# Patient Record
Sex: Male | Born: 2000 | Race: Black or African American | Hispanic: No | Marital: Single | Smoking: Never smoker
Health system: Southern US, Community
[De-identification: ages and names within clinical notes are randomized; demographics above are authoritative.]

---
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
## 2019-01-25 IMAGING — DX DG CHEST 2V
2 series · 2 of 2 positions shown · non-contrast
Comparison: None.

CLINICAL DATA: Productive cough since October 2017. New shortness of
breath.

EXAM:
CHEST - 2 VIEW

[w chest pa]
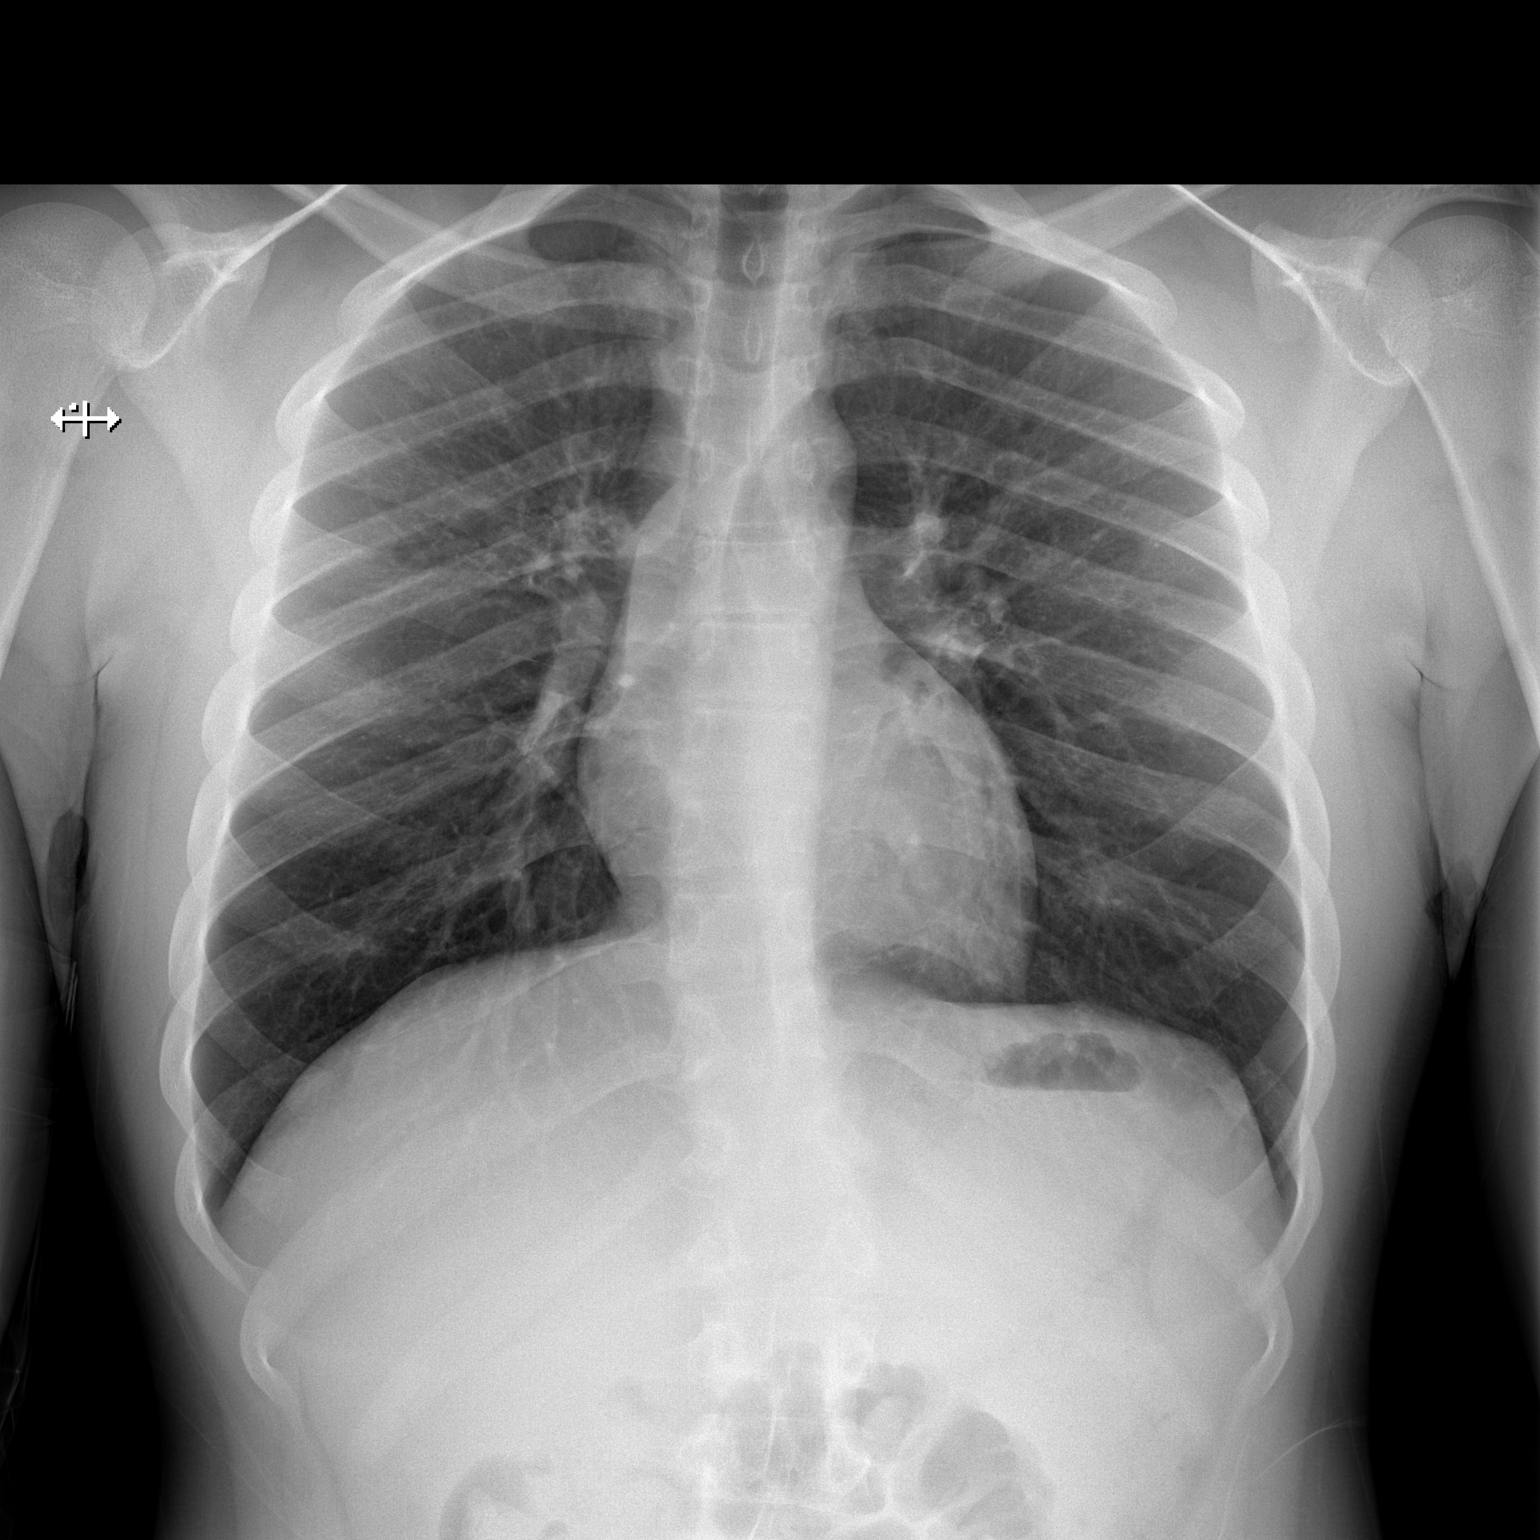

[w chest lat]
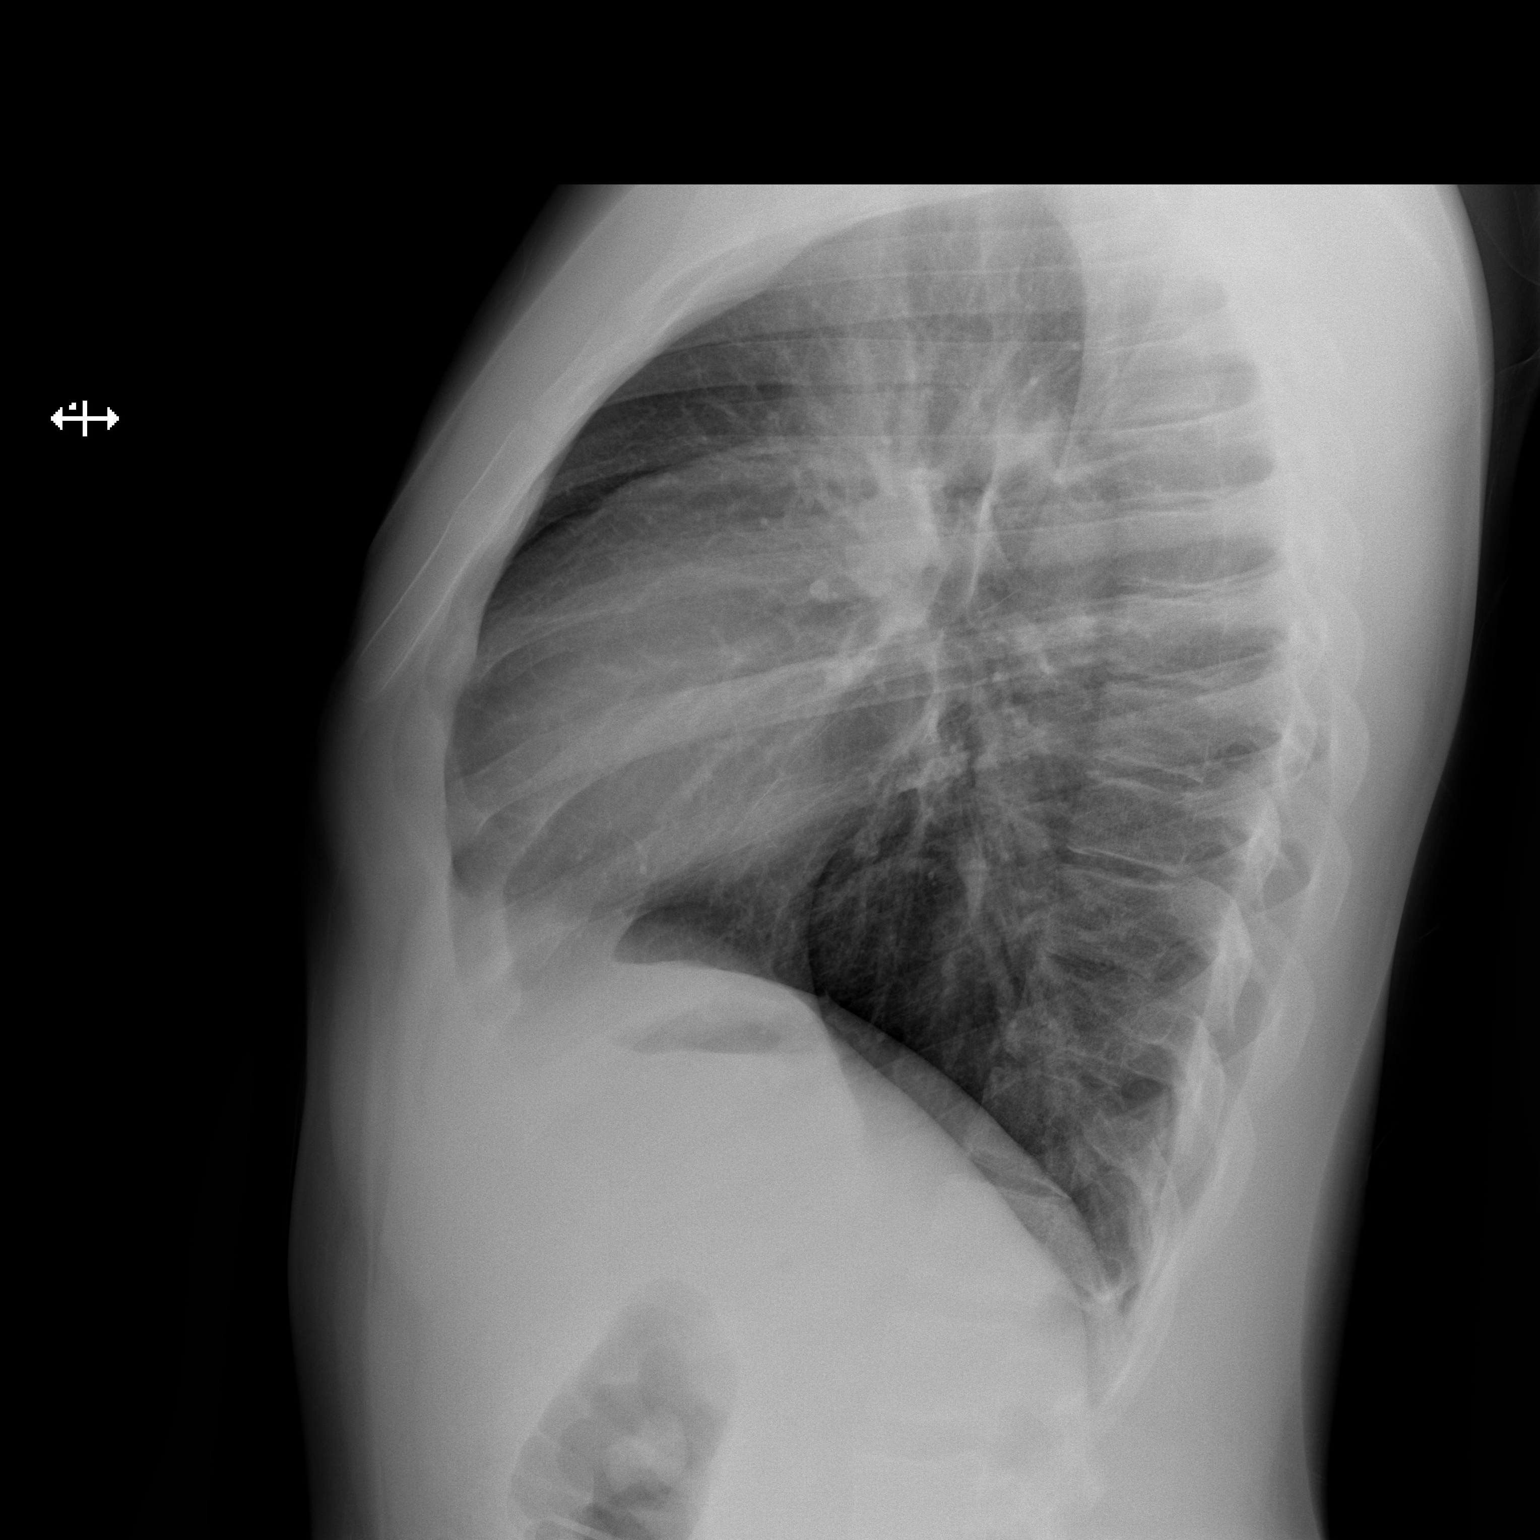

[2 of 2 positions shown; findings below may reference images not displayed]

FINDINGS: The heart size and mediastinal contours are within normal limits.
Both lungs are clear except for slight peribronchial thickening. No
effusions.. Minimal thoracolumbar scoliosis.
IMPRESSION: Slight bronchitic changes.
